# Patient Record
Sex: Male | Born: 1965 | Race: Black or African American | Hispanic: No | Marital: Married | Smoking: Never smoker
Health system: Southern US, Community
[De-identification: ages and names within clinical notes are randomized; demographics above are authoritative.]

## PROBLEM LIST (undated history)

## (undated) DIAGNOSIS — I10 Essential (primary) hypertension: Secondary | ICD-10-CM

## (undated) DIAGNOSIS — G47419 Narcolepsy without cataplexy: Secondary | ICD-10-CM

## (undated) HISTORY — PX: ROTATOR CUFF REPAIR: SHX139

## (undated) HISTORY — PX: NECK SURGERY: SHX720

---

## 2013-09-03 ENCOUNTER — Encounter (HOSPITAL_BASED_OUTPATIENT_CLINIC_OR_DEPARTMENT_OTHER): Payer: Self-pay | Admitting: Emergency Medicine

## 2013-09-03 ENCOUNTER — Emergency Department (HOSPITAL_BASED_OUTPATIENT_CLINIC_OR_DEPARTMENT_OTHER)
Admission: EM | Admit: 2013-09-03 | Discharge: 2013-09-04 | Disposition: A | Discharge status: Home / Self Care | Attending: Emergency Medicine | Admitting: Emergency Medicine

## 2013-09-03 DIAGNOSIS — L739 Follicular disorder, unspecified: Secondary | ICD-10-CM

## 2013-09-03 DIAGNOSIS — L678 Other hair color and hair shaft abnormalities: Secondary | ICD-10-CM | POA: Insufficient documentation

## 2013-09-03 DIAGNOSIS — G47419 Narcolepsy without cataplexy: Secondary | ICD-10-CM | POA: Insufficient documentation

## 2013-09-03 DIAGNOSIS — Z79899 Other long term (current) drug therapy: Secondary | ICD-10-CM | POA: Insufficient documentation

## 2013-09-03 DIAGNOSIS — L738 Other specified follicular disorders: Secondary | ICD-10-CM | POA: Insufficient documentation

## 2013-09-03 HISTORY — DX: Narcolepsy without cataplexy: G47.419

## 2013-09-03 NOTE — ED Notes (Signed)
Rash under left axilla since yesterday.  Noted several small hard nodules.  No drainage or severe swelling.

## 2013-09-03 NOTE — ED Notes (Signed)
Raised rash under right arm pit and pt reports pain to right shoulder nonradiating 6/10

## 2013-09-04 MED ORDER — IBUPROFEN 600 MG PO TABS: 600.0000 mg | ORAL_TABLET | Freq: Four times a day (QID) | ORAL | Status: AC | PRN

## 2013-09-04 NOTE — ED Provider Notes (Signed)
CSN: [id number]     Arrival date & time 09/03/13  2258 History  This chart was scribed for Dillon Mason Dillon Cords, MD by Caryn Bee, ED Scribe. This patient was seen in room MH04/MH04 and the patient's care was started 12:25 AM.    Chief Complaint  Patient presents with  . Rash    Patient is a 47 y.o. male presenting with rash. The history is provided by the patient. No language interpreter was used.  Rash Location: right axilla. Severity:  Mild Onset quality:  Gradual Duration:  1 day Timing:  Constant Progression:  Unchanged Chronicity:  New Context: not animal contact   Relieved by:  Nothing Worsened by:  Nothing tried Ineffective treatments:  None tried Associated symptoms: no fever    HPI Comments: Dillon Mason is a 47 y.o. male who presents to the Emergency Department complaining of gradual onset, constant rash to the right axilla that began yesterday. Pt denies any previous trauma to the area. Pt denies pain to touch, but pain is worse when the arm is positioned downwards.Pt denies trying any new deodorants, soaps, or detergents. Pt denies fever.   Past Medical History  Diagnosis Date  . Narcolepsy    Past Surgical History  Procedure Laterality Date  . Rotator cuff repair     History reviewed. No pertinent family history. History  Substance Use Topics  . Smoking status: Never Smoker   . Smokeless tobacco: Not on file  . Alcohol Use: No    Review of Systems  Constitutional: Negative for fever.  Skin: Positive for rash (right axilla).  All other systems reviewed and are negative.    Allergies  Review of patient's allergies indicates no known allergies.  Home Medications   Current Outpatient Rx  Name  Route  Sig  Dispense  Refill  . modafinil (PROVIGIL) 100 MG tablet   Oral   Take 100 mg by mouth daily.          BP 120/81  Pulse 70  Temp(Src) 98.6 F (37 C) (Oral)  Resp 16  Ht 6\' 2"  (1.88 m)  Wt 226 lb (102.513 kg)  BMI 29 kg/m2  SpO2  99% Physical Exam  Nursing note and vitals reviewed. Constitutional: He is oriented to person, place, and time. He appears well-developed and well-nourished.  HENT:  Head: Normocephalic and atraumatic.  Mouth/Throat: Oropharynx is clear and moist.  Eyes: Pupils are equal, round, and reactive to light.  Neck: Normal range of motion. Neck supple.  Cardiovascular: Normal rate, regular rhythm and normal heart sounds.   Pulmonary/Chest: Effort normal and breath sounds normal. He has no wheezes. He has no rales.  Abdominal: Soft. Bowel sounds are normal. There is no tenderness. There is no rebound and no guarding.  Musculoskeletal: Normal range of motion.  Lymphadenopathy:       Right axillary: No pectoral and no lateral adenopathy present.  Neurological: He is alert and oriented to person, place, and time.  Skin: Rash (right axilla) noted.  Non vesicular Inflammed follicles on right axilla and upper back with hairs growing out of each follicle. No white heads. No heads on bumps at all. No abscesses.   Psychiatric: He has a normal mood and affect.    ED Course  Procedures (including critical care time) DIAGNOSTIC STUDIES: Oxygen Saturation is 99% on room air, normal by my interpretation.    COORDINATION OF CARE: 12:21 AM-Advised pt not to use deodorant for 7 days. Will prescribe ibuprofen. Discussed treatment plan with pt  at bedside and pt agreed to plan.   Labs Review Labs Reviewed - No data to display Imaging Review No results found.  MDM  No diagnosis found. Inflamed follicles, not infected with ingrown hairs.  Keep clean and dry and give several days without roll on anti persperant   I personally performed the services described in this documentation, which was scribed in my presence. The recorded information has been reviewed and is accurate.     Jasmine Awe, MD 09/04/13 [phone]

## 2015-01-14 ENCOUNTER — Ambulatory Visit: Admitting: Family Medicine

## 2017-05-01 ENCOUNTER — Encounter (HOSPITAL_BASED_OUTPATIENT_CLINIC_OR_DEPARTMENT_OTHER): Payer: Self-pay | Admitting: Emergency Medicine

## 2017-05-01 ENCOUNTER — Emergency Department (HOSPITAL_BASED_OUTPATIENT_CLINIC_OR_DEPARTMENT_OTHER)
Admission: EM | Admit: 2017-05-01 | Discharge: 2017-05-01 | Disposition: A | Discharge status: Home / Self Care | Attending: Emergency Medicine | Admitting: Emergency Medicine

## 2017-05-01 DIAGNOSIS — Z113 Encounter for screening for infections with a predominantly sexual mode of transmission: Secondary | ICD-10-CM | POA: Diagnosis present

## 2017-05-01 DIAGNOSIS — Z711 Person with feared health complaint in whom no diagnosis is made: Secondary | ICD-10-CM

## 2017-05-01 DIAGNOSIS — Z202 Contact with and (suspected) exposure to infections with a predominantly sexual mode of transmission: Secondary | ICD-10-CM | POA: Insufficient documentation

## 2017-05-01 LAB — URINALYSIS, ROUTINE W REFLEX MICROSCOPIC
Bilirubin Urine: NEGATIVE
Glucose, UA: NEGATIVE mg/dL
Hgb urine dipstick: NEGATIVE
Ketones, ur: NEGATIVE mg/dL
Leukocytes, UA: NEGATIVE
Nitrite: NEGATIVE
Protein, ur: NEGATIVE mg/dL
Specific Gravity, Urine: 1.012 (ref 1.005–1.030)
pH: 6 (ref 5.0–8.0)

## 2017-05-01 MED ORDER — FLUCONAZOLE 50 MG PO TABS
150.0000 mg | ORAL_TABLET | Freq: Once | ORAL | Status: AC
  Administered 2017-05-01: 150 mg via ORAL
  Filled 2017-05-01: qty 1

## 2017-05-01 NOTE — ED Notes (Signed)
Pt discharged to home with family. NAD.  

## 2017-05-01 NOTE — ED Triage Notes (Signed)
Pt sts his wife has recurrent yeast infections and her doctor suggested pt be checked to see if he is giving her yeast infections. Pt denies any sxs.

## 2017-05-01 NOTE — ED Provider Notes (Signed)
[facility]   CSN: [id number] Arrival date & time: 05/01/17  1742   By signing my name below, I, Clarisse GougeXavier Herndon, attest that this documentation has been prepared under the direction and in the presence of Raeford RazorKohut, Aloria Looper, MD. Electronically signed, Clarisse GougeXavier Herndon, ED Scribe. 05/01/17. 8:24 PM.  History   Chief Complaint Chief Complaint  Patient presents with  . SEXUALLY TRANSMITTED DISEASE   The history is provided by the patient and medical records. No language interpreter was used.    Dillon Mason is a 51 y.o. male who presents to the Emergency Department for asymptomatic STD screening. Pt reports for STD check following advice from his wife's PCP, who reportedly told him to get checked to R/O the possibility that he is causing recurring yeast infections for his wife. No penile itching, pain other complaints at this time.   Past Medical History:  Diagnosis Date  . Narcolepsy     There are no active problems to display for this patient.   Past Surgical History:  Procedure Laterality Date  . ROTATOR CUFF REPAIR         Home Medications    Prior to Admission medications   Medication Sig Start Date End Date Taking? Authorizing Provider  ibuprofen (ADVIL,MOTRIN) 600 MG tablet Take 1 tablet (600 mg total) by mouth every 6 (six) hours as needed for pain. 09/04/13   Palumbo, April, MD  modafinil (PROVIGIL) 100 MG tablet Take 100 mg by mouth daily.    [provider]    Family History No family history on file.  Social History Social History  Substance Use Topics  . Smoking status: Never Smoker  . Smokeless tobacco: Never Used  . Alcohol use No     Allergies   Patient has no known allergies.   Review of Systems Review of Systems  Genitourinary: Negative for decreased urine volume, difficulty urinating, discharge, dysuria, penile pain, penile swelling, scrotal swelling and testicular pain.  Skin: Negative for color change and rash.    All other systems reviewed and are negative.    Physical Exam Updated Vital Signs BP (!) 131/100   Pulse 96   Temp 98.6 F (37 C) (Oral)   Resp 20   Ht 6\' 2"  (1.88 m)   Wt 230 lb (104.3 kg)   SpO2 100%   BMI 29.53 kg/m   Physical Exam  Constitutional: He is oriented to person, place, and time. He appears well-developed and well-nourished.  HENT:  Head: Normocephalic.  Eyes: EOM are normal.  Neck: Normal range of motion.  Pulmonary/Chest: Effort normal.  Abdominal: He exhibits no distension.  Genitourinary: Penis normal. Uncircumcised. No penile tenderness. No discharge found.  Musculoskeletal: Normal range of motion.  Neurological: He is alert and oriented to person, place, and time.  Psychiatric: He has a normal mood and affect.  Nursing note and vitals reviewed.    ED Treatments / Results  DIAGNOSTIC STUDIES: Oxygen Saturation is 100% on RA, NL by my interpretation.    COORDINATION OF CARE: 8:21 PM-Discussed next steps with pt. Pt verbalized understanding and is agreeable with the plan. Will order medications. Pt prepared for d/c, advised of symptomatic care at home and return precautions.    Labs (all labs ordered are listed, but only abnormal results are displayed) Labs Reviewed  URINALYSIS, ROUTINE W REFLEX MICROSCOPIC    EKG  EKG Interpretation None       Radiology No results found.  Procedures Procedures (including critical care time)  Medications Ordered in ED Medications - No data to display   Initial Impression / Assessment and Plan / ED Course  I have reviewed the triage vital signs and the nursing notes.  Pertinent labs & imaging results that were available during my care of the patient were reviewed by me and considered in my medical decision making (see chart for details).     50yM with partner that gets frequent yeast infections and he wanted "checked out." Tried having discussion about basic pathophysiology of male  vulvovaginitis but ended up giving him a dose of diflucan. This made him happy. His exam is unremarkable. Declined STD testing or further empiric tx.   Final Clinical Impressions(s) / ED Diagnoses   Final diagnoses:  Concern about sexually transmitted disease in male without diagnosis    New Prescriptions New Prescriptions   No medications on file    I personally preformed the services scribed in my presence. The recorded information has been reviewed is accurate. Raeford Razor, MD.    Raeford Razor, MD 05/09/17 [phone]

## 2017-06-12 ENCOUNTER — Emergency Department (HOSPITAL_BASED_OUTPATIENT_CLINIC_OR_DEPARTMENT_OTHER)

## 2017-06-12 ENCOUNTER — Encounter (HOSPITAL_BASED_OUTPATIENT_CLINIC_OR_DEPARTMENT_OTHER): Payer: Self-pay | Admitting: Emergency Medicine

## 2017-06-12 ENCOUNTER — Emergency Department (HOSPITAL_BASED_OUTPATIENT_CLINIC_OR_DEPARTMENT_OTHER)
Admission: EM | Admit: 2017-06-12 | Discharge: 2017-06-12 | Disposition: A | Discharge status: Home / Self Care | Attending: Emergency Medicine | Admitting: Emergency Medicine

## 2017-06-12 DIAGNOSIS — J181 Lobar pneumonia, unspecified organism: Secondary | ICD-10-CM | POA: Diagnosis not present

## 2017-06-12 DIAGNOSIS — Z79899 Other long term (current) drug therapy: Secondary | ICD-10-CM | POA: Insufficient documentation

## 2017-06-12 DIAGNOSIS — J189 Pneumonia, unspecified organism: Secondary | ICD-10-CM

## 2017-06-12 DIAGNOSIS — Z791 Long term (current) use of non-steroidal anti-inflammatories (NSAID): Secondary | ICD-10-CM | POA: Insufficient documentation

## 2017-06-12 DIAGNOSIS — R05 Cough: Secondary | ICD-10-CM | POA: Diagnosis present

## 2017-06-12 DIAGNOSIS — I10 Essential (primary) hypertension: Secondary | ICD-10-CM | POA: Diagnosis not present

## 2017-06-12 MED ORDER — DOXYCYCLINE HYCLATE 100 MG PO CAPS: 100.0000 mg | ORAL_CAPSULE | Freq: Two times a day (BID) | ORAL | 0 refills | Status: AC

## 2017-06-12 MED ORDER — DOXYCYCLINE HYCLATE 100 MG PO TABS
100.0000 mg | ORAL_TABLET | Freq: Once | ORAL | Status: AC
  Administered 2017-06-12: 100 mg via ORAL
  Filled 2017-06-12: qty 1

## 2017-06-12 NOTE — ED Provider Notes (Signed)
[facility]   CSN: [id number] Arrival date & time: 06/12/17  1956  By signing my name below, I, Rosana Fret, attest that this documentation has been prepared under the direction and in the presence of Cardama, Amadeo Garnet, MD. Electronically Signed: Rosana Fret, ED Scribe. 06/12/17. 10:27 PM.  History   Chief Complaint Chief Complaint  Patient presents with  . Cough  . Chest Pain   The history is provided by the patient. No language interpreter was used.   HPI Comments: Dillon Mason is a 51 y.o. male with a PMHx of GERD and HTN, who presents to the Emergency Department complaining of an intermittent, mildly productive cough onset 2 days ago. Pt reports associated chest congestion and he describes the pain as a pressure sensation. Per pt, he had cold symptoms 2 weeks ago that resolved. 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 Pt states he uses a CPAP machine but not regularly. Pt does not take medication for his HTN. Pt denies hx of CAD, PE/DVT, DM, HLD and asthma. No recent long travel, surgery, fracture, prolonged immobilization, or exogenous testosterone usage. No family hx of CAD. No tobacco use. Pt denies current rhinorrhea, fever, nausea, abdominal pain or any other complaints at this time.  Past Medical History:  Diagnosis Date  . Narcolepsy     There are no active problems to display for this patient.   Past Surgical History:  Procedure Laterality Date  . ROTATOR CUFF REPAIR         Home Medications    Prior to Admission medications   Medication Sig Start Date End Date Taking? Authorizing Provider  doxycycline (VIBRAMYCIN) 100 MG capsule Take 1 capsule (100 mg total) by mouth 2 (two) times daily. 06/12/17 06/19/17  Nira Conn, MD  ibuprofen (ADVIL,MOTRIN) 600 MG tablet Take 1 tablet (600 mg total) by mouth every 6 (six) hours as needed for pain. 09/04/13   Palumbo, April, MD  modafinil (PROVIGIL) 100 MG tablet Take 100 mg by  mouth daily.    [provider]    Family History No family history on file.  Social History Social History  Substance Use Topics  . Smoking status: Never Smoker  . Smokeless tobacco: Never Used  . Alcohol use No     Allergies   Patient has no known allergies.   Review of Systems Review of Systems All other systems reviewed and are negative for acute change except as noted in the HPI.  Physical Exam Updated Vital Signs BP 127/80 (BP Location: Left Arm)   Pulse 98   Temp 98.5 F (36.9 C) (Oral)   Resp 20   SpO2 97%   Physical Exam  Constitutional: He is oriented to person, place, and time. He appears well-developed and well-nourished. No distress.  HENT:  Head: Normocephalic and atraumatic.  Nose: Nose normal.  Eyes: Conjunctivae and EOM are normal. Pupils are equal, round, and reactive to light. Right eye exhibits no discharge. Left eye exhibits no discharge. No scleral icterus.  Neck: Normal range of motion. Neck supple.  Cardiovascular: Normal rate and regular rhythm.  Exam reveals no gallop and no friction rub.   No murmur heard. Pulmonary/Chest: Effort normal and breath sounds normal. No stridor. No respiratory distress. He has no rales.  Abdominal: Soft. He exhibits no distension. There is no tenderness.  Musculoskeletal: He exhibits no edema or tenderness.  Neurological: He is alert and oriented to person, place, and time.  Skin: Skin is warm  and dry. No rash noted. He is not diaphoretic. No erythema.  Psychiatric: He has a normal mood and affect.  Vitals reviewed.    ED Treatments / Results  DIAGNOSTIC STUDIES: Oxygen Saturation is 100% on RA, normal by my interpretation.   COORDINATION OF CARE: 10:24 PM-Discussed next steps with pt including treatment for PNA and follow up with PCP. Pt verbalized understanding and is agreeable with the plan.   Labs (all labs ordered are listed, but only abnormal results are displayed) Labs Reviewed - No  data to display  EKG  EKG Interpretation  Date/Time:  Saturday June 12 2017 20:04:03 EDT Ventricular Rate:  95 PR Interval:  170 QRS Duration: 94 QT Interval:  352 QTC Calculation: 442 R Axis:   33 Text Interpretation:  Normal sinus rhythm Nonspecific T wave abnormality Abnormal ECG no stemi   No old tracing to compare Confirmed by Drema Pry [id number]) on 06/12/2017 8:47:25 PM       Radiology Dg Chest 2 View  Result Date: 06/12/2017 CLINICAL DATA:  Chest pain EXAM: CHEST  2 VIEW COMPARISON:  None. FINDINGS: No pneumothorax. The heart, hila, and mediastinum are normal. Coarsened lung markings. Possible more focal subtle infiltrate in medial right lung base on the frontal view. No other acute abnormalities. IMPRESSION: Coarsened lung markings may represent bronchitic change. Mild opacity in the medial right lung base could represent an early infiltrate in the appropriate clinical setting. Electronically Signed   By: Gerome Sam III M.D   On: 06/12/2017 20:48    Procedures Procedures (including critical care time)  Medications Ordered in ED Medications  doxycycline (VIBRA-TABS) tablet 100 mg (100 mg Oral Given 06/12/17 2321)     Initial Impression / Assessment and Plan / ED Course  I have reviewed the triage vital signs and the nursing notes.  Pertinent labs & imaging results that were available during my care of the patient were reviewed by me and considered in my medical decision making (see chart for details).     Chest x-ray with RLL opacity consistent with CAP. No evidence suggestive of pneumothorax, pneumomediastinum.  No abnormal contour of the mediastinum to suggest dissection. No evidence of acute injuries.  Low suspicion for cardiac etiology including ACS or HF. Low pretest probability for pulmonary embolism. Presentation not classic for aortic dissection or esophageal perforation.  Will treat with doxycycline given that this followed a likely viral process. Given  first dose here.  The patient is safe for discharge with strict return precautions.   Final Clinical Impressions(s) / ED Diagnoses   Final diagnoses:  Community acquired pneumonia of right lower lobe of lung (HCC)   Disposition: Discharge  Condition: Good  I have discussed the results, Dx and Tx plan with the patient who expressed understanding and agree(s) with the plan. Discharge instructions discussed at great length. The patient was given strict return precautions who verbalized understanding of the instructions. No further questions at time of discharge.    Discharge Medication List as of 06/12/2017 10:51 PM    START taking these medications   Details  doxycycline (VIBRAMYCIN) 100 MG capsule Take 1 capsule (100 mg total) by mouth 2 (two) times daily., Starting Sat 06/12/2017, Until Sat 06/19/2017, Print        Follow Up: Janece Canterbury, MD [address] [place] [address] [phone]  Schedule an appointment as soon as possible for a visit  in 5-7 days, If symptoms do not improve or  worsen   I personally performed  the services described in this documentation, which was scribed in my presence. The recorded information has been reviewed and is accurate.        Nira Connardama, Pedro Eduardo, MD 06/12/17 2350

## 2017-06-12 NOTE — ED Triage Notes (Signed)
Pt presents with c/o cough for a couple of days and chest pain that started today. Pt states he has chest pain when taking a deep breath also.

## 2021-01-24 ENCOUNTER — Encounter (HOSPITAL_BASED_OUTPATIENT_CLINIC_OR_DEPARTMENT_OTHER): Payer: Self-pay

## 2021-01-24 ENCOUNTER — Emergency Department (HOSPITAL_BASED_OUTPATIENT_CLINIC_OR_DEPARTMENT_OTHER)

## 2021-01-24 ENCOUNTER — Emergency Department (HOSPITAL_BASED_OUTPATIENT_CLINIC_OR_DEPARTMENT_OTHER)
Admission: EM | Admit: 2021-01-24 | Discharge: 2021-01-24 | Disposition: A | Discharge status: Home / Self Care | Attending: Emergency Medicine | Admitting: Emergency Medicine

## 2021-01-24 ENCOUNTER — Other Ambulatory Visit: Payer: Self-pay

## 2021-01-24 DIAGNOSIS — S62636A Displaced fracture of distal phalanx of right little finger, initial encounter for closed fracture: Secondary | ICD-10-CM | POA: Insufficient documentation

## 2021-01-24 DIAGNOSIS — W230XXA Caught, crushed, jammed, or pinched between moving objects, initial encounter: Secondary | ICD-10-CM | POA: Diagnosis not present

## 2021-01-24 DIAGNOSIS — I10 Essential (primary) hypertension: Secondary | ICD-10-CM | POA: Diagnosis not present

## 2021-01-24 DIAGNOSIS — S6991XA Unspecified injury of right wrist, hand and finger(s), initial encounter: Secondary | ICD-10-CM | POA: Diagnosis present

## 2021-01-24 HISTORY — DX: Essential (primary) hypertension: I10

## 2021-01-24 MED ORDER — ACETAMINOPHEN 325 MG PO TABS
650.0000 mg | ORAL_TABLET | Freq: Once | ORAL | Status: AC
  Administered 2021-01-24: 650 mg via ORAL
  Filled 2021-01-24: qty 2

## 2021-01-24 NOTE — Discharge Instructions (Addendum)
Call your primary care doctor or specialist as discussed in the next 5-6 days.   Return immediately back to the ER if:  Your symptoms worsen within the next 12-24 hours. You develop new symptoms such as new fevers, persistent vomiting, new pain, shortness of breath, or new weakness or numbness, or if you have any other concerns.  

## 2021-01-24 NOTE — ED Provider Notes (Signed)
[facility] EMERGENCY DEPARTMENT Provider Note   CSN: [id number] Arrival date & time: 01/24/21  2013     History Chief Complaint  Patient presents with  . Finger Injury    Dillon Mason is a 55 y.o. male.  Patient presents with pain to the right and fifth finger.  He states he was closing luggage door when it slammed down onto his right hand fifth finger.  Injury occurred earlier today.  Denies injury anywhere else.  No fever no vomiting cough no diarrhea.        Past Medical History:  Diagnosis Date  . Hypertension   . Narcolepsy     There are no problems to display for this patient.   Past Surgical History:  Procedure Laterality Date  . NECK SURGERY    . ROTATOR CUFF REPAIR         No family history on file.  Social History   Tobacco Use  . Smoking status: Never Smoker  . Smokeless tobacco: Never Used  Vaping Use  . Vaping Use: Never used  Substance Use Topics  . Alcohol use: No  . Drug use: No    Home Medications Prior to Admission medications   Medication Sig Start Date End Date Taking? Authorizing Provider  ibuprofen (ADVIL,MOTRIN) 600 MG tablet Take 1 tablet (600 mg total) by mouth every 6 (six) hours as needed for pain. 09/04/13   Palumbo, April, MD  modafinil (PROVIGIL) 100 MG tablet Take 100 mg by mouth daily.    [provider]    Allergies    Patient has no known allergies.  Review of Systems   Review of Systems  Constitutional: Negative for fever.  HENT: Negative for ear pain and sore throat.   Eyes: Negative for pain.  Respiratory: Negative for cough.   Cardiovascular: Negative for chest pain.  Gastrointestinal: Negative for abdominal pain.  Genitourinary: Negative for flank pain.  Musculoskeletal: Negative for back pain.  Skin: Negative for color change and rash.  Neurological: Negative for syncope.  All other systems reviewed and are negative.   Physical Exam Updated Vital Signs BP (!) 133/109 (BP Location:  Right Arm)   Pulse 73   Temp 98.8 F (37.1 C) (Oral)   Resp 20   SpO2 99%   Physical Exam Constitutional:      General: He is not in acute distress.    Appearance: He is well-developed.  HENT:     Head: Normocephalic.     Nose: Nose normal.  Eyes:     Extraocular Movements: Extraocular movements intact.  Cardiovascular:     Rate and Rhythm: Normal rate.  Pulmonary:     Effort: Pulmonary effort is normal.  Musculoskeletal:     Comments: No visible deformity noted on exam.  No skin laceration or significant swelling noted.  No ungual hematoma noted or ecchymosis noted.  Tenderness palpation right hand fifth finger distal tip.  Nailbed appears intact with no obvious injury on visual exam.    Skin:    Coloration: Skin is not jaundiced.  Neurological:     Mental Status: He is alert. Mental status is at baseline.     ED Results / Procedures / Treatments   Labs (all labs ordered are listed, but only abnormal results are displayed) Labs Reviewed - No data to display  EKG None  Radiology DG Finger Little Right  Result Date: 01/24/2021 CLINICAL DATA:  Fifth digit injury today with pain, initial encounter EXAM: RIGHT LITTLE FINGER 2+V  COMPARISON:  None. FINDINGS: Transverse fracture is noted through the fifth distal phalanx with mild posterior displacement of the distal fracture fragment. No soft tissue abnormality is noted. IMPRESSION: Fifth distal phalangeal fracture. Electronically Signed   By: Alcide Clever M.D.   On: 01/24/2021 20:52    Procedures .Ortho Injury Treatment  Date/Time: 01/24/2021 10:34 PM Performed by: Cheryll Cockayne, MD Authorized by: Cheryll Cockayne, MD  Comments: Splint to right hand distal fingertip.  Neurovascular tact after placement.      Medications Ordered in ED Medications  acetaminophen (TYLENOL) tablet 650 mg (650 mg Oral Given 01/24/21 2216)    ED Course  I have reviewed the triage vital signs and the nursing notes.  Pertinent labs &  imaging results that were available during my care of the patient were reviewed by me and considered in my medical decision making (see chart for details).    MDM Rules/Calculators/A&P                          Active the distal fingertip seen on x-ray.  Splint of the fingertip placed.  Norvasc intact after placement.  Advised follow-up with hand surgery within the week.  Advised me to return for worsening pain fevers or any additional concerns.   Final Clinical Impression(s) / ED Diagnoses Final diagnoses:  Closed displaced fracture of distal phalanx of right little finger, initial encounter    Rx / DC Orders ED Discharge Orders    None       Cheryll Cockayne, MD 01/24/21 2234

## 2021-01-24 NOTE — ED Triage Notes (Signed)
Pt states he "got finger bent up" right pinky finger ~930am-NAD-steady gait

## 2021-01-24 NOTE — ED Notes (Signed)
Injury to rt 5th digit, states injured finger when closing the luggage bin on a touring bus, finger was deformed, splint has been ordered by EDP, color WNL, capillary refill WNL, temp and sensation WNL as well, Tylenol PO for pain given per orders

## 2021-01-24 NOTE — ED Notes (Signed)
Finger Splint applied to Rt 5th digit per EDP orders, splint applied by EMT NT. Finger reevaluated post splint application

## 2022-03-02 ENCOUNTER — Emergency Department (HOSPITAL_BASED_OUTPATIENT_CLINIC_OR_DEPARTMENT_OTHER)

## 2022-03-02 ENCOUNTER — Other Ambulatory Visit: Payer: Self-pay

## 2022-03-02 ENCOUNTER — Emergency Department (HOSPITAL_BASED_OUTPATIENT_CLINIC_OR_DEPARTMENT_OTHER)
Admission: EM | Admit: 2022-03-02 | Discharge: 2022-03-02 | Disposition: A | Discharge status: Home / Self Care | Attending: Emergency Medicine | Admitting: Emergency Medicine

## 2022-03-02 ENCOUNTER — Encounter (HOSPITAL_BASED_OUTPATIENT_CLINIC_OR_DEPARTMENT_OTHER): Payer: Self-pay | Admitting: Urology

## 2022-03-02 DIAGNOSIS — I10 Essential (primary) hypertension: Secondary | ICD-10-CM | POA: Insufficient documentation

## 2022-03-02 DIAGNOSIS — Y93H2 Activity, gardening and landscaping: Secondary | ICD-10-CM | POA: Diagnosis not present

## 2022-03-02 DIAGNOSIS — S39012A Strain of muscle, fascia and tendon of lower back, initial encounter: Secondary | ICD-10-CM | POA: Diagnosis not present

## 2022-03-02 DIAGNOSIS — X58XXXA Exposure to other specified factors, initial encounter: Secondary | ICD-10-CM | POA: Insufficient documentation

## 2022-03-02 DIAGNOSIS — Y92007 Garden or yard of unspecified non-institutional (private) residence as the place of occurrence of the external cause: Secondary | ICD-10-CM | POA: Insufficient documentation

## 2022-03-02 DIAGNOSIS — S3992XA Unspecified injury of lower back, initial encounter: Secondary | ICD-10-CM | POA: Diagnosis present

## 2022-03-02 LAB — URINALYSIS, ROUTINE W REFLEX MICROSCOPIC
Bilirubin Urine: NEGATIVE
Glucose, UA: NEGATIVE mg/dL
Hgb urine dipstick: NEGATIVE
Ketones, ur: NEGATIVE mg/dL
Leukocytes,Ua: NEGATIVE
Nitrite: NEGATIVE
Protein, ur: NEGATIVE mg/dL
Specific Gravity, Urine: >=1.03 (ref 1.005–1.030)
pH: 5.5 (ref 5.0–8.0)

## 2022-03-02 MED ORDER — MELOXICAM 15 MG PO TABS: 15.0000 mg | ORAL_TABLET | Freq: Every day | ORAL | 0 refills | Status: AC

## 2022-03-02 MED ORDER — METHOCARBAMOL 500 MG PO TABS: 500.0000 mg | ORAL_TABLET | Freq: Two times a day (BID) | ORAL | 0 refills | Status: AC

## 2022-03-02 NOTE — ED Provider Notes (Signed)
?[facility] EMERGENCY DEPARTMENT ?Provider Note ? ? ?CSN: [id number] ?Arrival date & time: 03/02/22  2113 ? ?  ? ?History ? ?Chief Complaint  ?Patient presents with  ? Back Pain  ? ? ?Dillon Mason is a 56 y.o. male.  The patient presents to the emergency department complaining of right sided lumbar pain. The patient states his pain began after doing heavy yardwork approximately one week ago. He was seen by his PCP and was diagnosed with a muscle strain. He denies known injury. Denies saddle anesthesia or incontinence. PMH significant for HTN and narcolepsy ? ?HPI ? ?  ? ?Home Medications ?Prior to Admission medications   ?Medication Sig Start Date End Date Taking? Authorizing Provider  ?meloxicam (MOBIC) 15 MG tablet Take 1 tablet (15 mg total) by mouth daily for 15 days. 03/02/22 03/17/22 Yes Darrick Grinder, PA-C  ?methocarbamol (ROBAXIN) 500 MG tablet Take 1 tablet (500 mg total) by mouth 2 (two) times daily. 03/02/22  Yes Darrick Grinder, PA-C  ?ibuprofen (ADVIL,MOTRIN) 600 MG tablet Take 1 tablet (600 mg total) by mouth every 6 (six) hours as needed for pain. 09/04/13   Palumbo, April, MD  ?modafinil (PROVIGIL) 100 MG tablet Take 100 mg by mouth daily.    [provider]  ?   ? ?Allergies    ?Patient has no known allergies.   ? ?Review of Systems   ?Review of Systems  ?Constitutional:  Negative for fever and unexpected weight change.  ?Respiratory:  Negative for shortness of breath.   ?Cardiovascular:  Negative for chest pain.  ?Gastrointestinal:  Negative for abdominal pain.  ?Genitourinary:  Negative for dysuria, frequency and hematuria.  ?Musculoskeletal:  Positive for back pain.  ?Neurological:  Negative for numbness.  ? ?Physical Exam ?Updated Vital Signs ?BP (!) 133/104 (BP Location: Left Arm)   Pulse 64   Temp 97.6 ?F (36.4 ?C) (Oral)   Resp 18   Ht 6\' 2"  (1.88 m)   Wt 108.9 kg   SpO2 100%   BMI 30.81 kg/m?  ?Physical Exam ?Constitutional:   ?   General: He is not in acute  distress. ?HENT:  ?   Head: Normocephalic.  ?Eyes:  ?   Conjunctiva/sclera: Conjunctivae normal.  ?Cardiovascular:  ?   Rate and Rhythm: Normal rate and regular rhythm.  ?   Pulses: Normal pulses.  ?Pulmonary:  ?   Effort: Pulmonary effort is normal.  ?   Breath sounds: Normal breath sounds.  ?Musculoskeletal:     ?   General: Tenderness (Mild tenderness to palpation of the right lumbar region. No palpation over spine or paraspinal muscles) present. No swelling.  ?   Cervical back: Normal range of motion.  ?Skin: ?   General: Skin is warm and dry.  ?Neurological:  ?   General: No focal deficit present.  ?   Mental Status: He is alert.  ? ? ?ED Results / Procedures / Treatments   ?Labs ?(all labs ordered are listed, but only abnormal results are displayed) ?Labs Reviewed  ?URINALYSIS, ROUTINE W REFLEX MICROSCOPIC  ? ? ?EKG ?None ? ?Radiology ?DG Lumbar Spine Complete ? ?Result Date: 03/02/2022 ?CLINICAL DATA:  Low back pain for 2 weeks, no known injury, initial encounter EXAM: LUMBAR SPINE - COMPLETE 4+ VIEW COMPARISON:  None. FINDINGS: Five lumbar type vertebral bodies are well visualized. Vertebral body height is well maintained. Osteophytic changes are noted from L2-L5. Disc space narrowing at L3-4 and L4-5 is noted. No anterolisthesis is seen. No  pars defects are noted. No soft tissue changes are seen. IMPRESSION: Degenerative change without acute abnormality. Electronically Signed   By: Alcide Clever M.D.   On: 03/02/2022 21:59   ? ?Procedures ?Procedures  ? ?Medications Ordered in ED ?Medications - No data to display ? ?ED Course/ Medical Decision Making/ A&P ?  ?                        ?Medical Decision Making ?Amount and/or Complexity of Data Reviewed ?Labs: ordered. ?Radiology: ordered. ? ?Risk ?Prescription drug management. ? ? ?The patient presents with lumbar back pain. Differential diagnosis includes muscle strain, DDD, fracture, cauda equina, and others. Imaging of the lumbar spine was completed in triage  which showed no acute injury. It did show some degenerative changes. The patient has no symptoms indicative of cauda equina. A urinalysis was performed in triage which was unremarkable. The patient has no CVA tenderness. I believe that kidney stones or pyelonephritis is unlikely. The patient likely has a muscle strain as previously diagnosed by his PCP. I see no indication for further lab work or imaging at this time and see no reason for hospital admission. The patient may discharge home. I will prescribe Meloxicam and a muscle relaxer. I advised the patient to not drive while taking the muscle relaxer. I also advised the patient to not lift heavy objects for the next few weeks and recommended heat or ice for relief. The patient agreed with the plan. Discharge home. Return precautions provided.  ? ?Final Clinical Impression(s) / ED Diagnoses ?Final diagnoses:  ?Strain of lumbar region, initial encounter  ? ? ?Rx / DC Orders ?ED Discharge Orders   ? ?      Ordered  ?  meloxicam (MOBIC) 15 MG tablet  Daily       ? 03/02/22 2322  ?  methocarbamol (ROBAXIN) 500 MG tablet  2 times daily       ? 03/02/22 2322  ? ?  ?  ? ?  ? ? ?  ?Darrick Grinder, PA-C ?03/02/22 2337 ? ?  ?Rozelle Logan, DO ?03/03/22 1620 ? ?

## 2022-03-02 NOTE — Discharge Instructions (Addendum)
You were seen today for lower right back pain. This is likely a muscle strain. Your x-rays showed degenerative changes in the lumbar spine but no acute injury. Muscle strains are treated with rest, heat, NSAIDs, and Tylenol as needed. I have provided a prescription for Meloxicam which is to be taken once daily and a muscle relaxer which is to be taken as needed prior to sleep.  Do not drive while taking the muscle relaxer. Use heat as needed. Follow up with your PCP if the problem continues. Return to the emergency department if you develop life threats such as chest pain or shortness of breath ?

## 2022-03-02 NOTE — ED Notes (Signed)
Pt states unable to urinate at this time, given spec cup while waiting ?

## 2022-03-02 NOTE — ED Triage Notes (Signed)
Lower right back pain x 2 weeks ?Denies any injury  ?Saw pcp x 1 week ago , dx with pulled muscle  ?Denies any urinary symptoms  ?Pain worse with movement  ?

## 2022-09-27 ENCOUNTER — Encounter (HOSPITAL_BASED_OUTPATIENT_CLINIC_OR_DEPARTMENT_OTHER): Payer: Self-pay

## 2022-09-27 ENCOUNTER — Other Ambulatory Visit: Payer: Self-pay

## 2022-09-27 ENCOUNTER — Emergency Department (HOSPITAL_BASED_OUTPATIENT_CLINIC_OR_DEPARTMENT_OTHER)
Admission: EM | Admit: 2022-09-27 | Discharge: 2022-09-27 | Disposition: A | Discharge status: Home / Self Care | Attending: Emergency Medicine | Admitting: Emergency Medicine

## 2022-09-27 DIAGNOSIS — J069 Acute upper respiratory infection, unspecified: Secondary | ICD-10-CM | POA: Diagnosis not present

## 2022-09-27 DIAGNOSIS — I10 Essential (primary) hypertension: Secondary | ICD-10-CM | POA: Diagnosis not present

## 2022-09-27 DIAGNOSIS — Z20822 Contact with and (suspected) exposure to covid-19: Secondary | ICD-10-CM | POA: Diagnosis not present

## 2022-09-27 DIAGNOSIS — J029 Acute pharyngitis, unspecified: Secondary | ICD-10-CM | POA: Diagnosis present

## 2022-09-27 LAB — RESP PANEL BY RT-PCR (FLU A&B, COVID) ARPGX2
Influenza A by PCR: NEGATIVE
Influenza B by PCR: NEGATIVE
SARS Coronavirus 2 by RT PCR: NEGATIVE

## 2022-09-27 MED ORDER — ACETAMINOPHEN 325 MG PO TABS
650.0000 mg | ORAL_TABLET | Freq: Once | ORAL | Status: AC
  Administered 2022-09-27: 650 mg via ORAL
  Filled 2022-09-27: qty 2

## 2022-09-27 NOTE — ED Triage Notes (Addendum)
Pt has had non-productive cough, subjective fever, sore throat and trouble sleeping since Wednesday. Denies diarrhea. Took nyquil with no relief; thinks he has COVID.

## 2022-09-27 NOTE — ED Provider Notes (Signed)
[facility] EMERGENCY DEPARTMENT  Provider Note  CSN: [id number] Arrival date & time: 09/27/22 6144  History Chief Complaint  Patient presents with   Cough    Dillon Mason is a 56 y.o. male with history of HTN reports 3 days of sore throat, nasal congestion, dry cough and subjective fever at home. Took some nyquil without relief. No known sick contacts. Worried he has Covid.    Home Medications Prior to Admission medications   Medication Sig Start Date End Date Taking? Authorizing Provider  ibuprofen (ADVIL,MOTRIN) 600 MG tablet Take 1 tablet (600 mg total) by mouth every 6 (six) hours as needed for pain. 09/04/13   Palumbo, April, MD  methocarbamol (ROBAXIN) 500 MG tablet Take 1 tablet (500 mg total) by mouth 2 (two) times daily. 03/02/22   Darrick Grinder, PA-C  modafinil (PROVIGIL) 100 MG tablet Take 100 mg by mouth daily.    [provider]     Allergies    Patient has no known allergies.   Review of Systems   Review of Systems Please see HPI for pertinent positives and negatives  Physical Exam BP (!) 155/109 (BP Location: Right Arm)   Pulse 73   Temp 98.2 F (36.8 C) (Oral)   Resp 20   Ht 6\' 2"  (1.88 m)   Wt 108.9 kg   SpO2 97%   BMI 30.81 kg/m   Physical Exam Vitals and nursing note reviewed.  Constitutional:      Appearance: Normal appearance.  HENT:     Head: Normocephalic and atraumatic.     Nose: Nose normal.     Mouth/Throat:     Mouth: Mucous membranes are moist.  Eyes:     Extraocular Movements: Extraocular movements intact.     Conjunctiva/sclera: Conjunctivae normal.  Cardiovascular:     Rate and Rhythm: Normal rate.  Pulmonary:     Effort: Pulmonary effort is normal.     Breath sounds: Normal breath sounds.  Abdominal:     General: Abdomen is flat.     Palpations: Abdomen is soft.     Tenderness: There is no abdominal tenderness.  Musculoskeletal:        General: No swelling. Normal range of motion.     Cervical back:  Neck supple.  Skin:    General: Skin is warm and dry.  Neurological:     General: No focal deficit present.     Mental Status: He is alert.  Psychiatric:        Mood and Affect: Mood normal.     ED Results / Procedures / Treatments   EKG None  Procedures Procedures  Medications Ordered in the ED Medications - No data to display  Initial Impression and Plan  Patient here with viral URI symptoms. Will check for Covid. Vitals and exam are reassuring.   ED Course   Clinical Course as of 09/27/22 0437  Sun Sep 27, 2022  0435 Covid/Flu are negative. Patient likely has a nonspecific viral URI. Recommend continued supportive care at home. PCP follow up or RTED if symptoms worsen.  [CS]    Clinical Course User Index [CS] Sep 29, 2022, MD     MDM Rules/Calculators/A&P Medical Decision Making Problems Addressed: Viral URI with cough: acute illness or injury  Amount and/or Complexity of Data Reviewed Labs: ordered. Decision-making details documented in ED Course.  Risk OTC drugs.    Final Clinical Impression(s) / ED Diagnoses Final diagnoses:  Viral URI with cough  Rx / DC Orders ED Discharge Orders     None        Truddie Hidden, MD 09/27/22 [phone]

## 2022-12-31 IMAGING — CR DG LUMBAR SPINE COMPLETE 4+V
5 series · 5 of 5 positions shown · non-contrast
Comparison: None.

CLINICAL DATA: Low back pain for 2 weeks, no known injury, initial
encounter

EXAM:
LUMBAR SPINE - COMPLETE 4+ VIEW

[t l-spine a.p.]
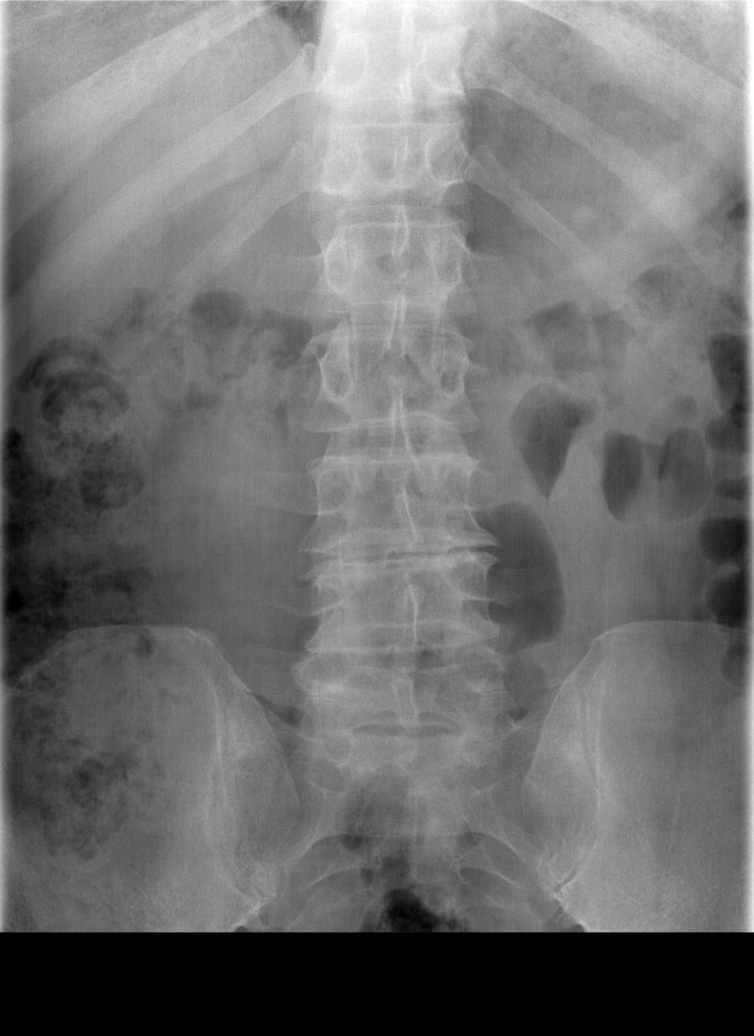

[t l-spine oblique exposure (1 of 2)]
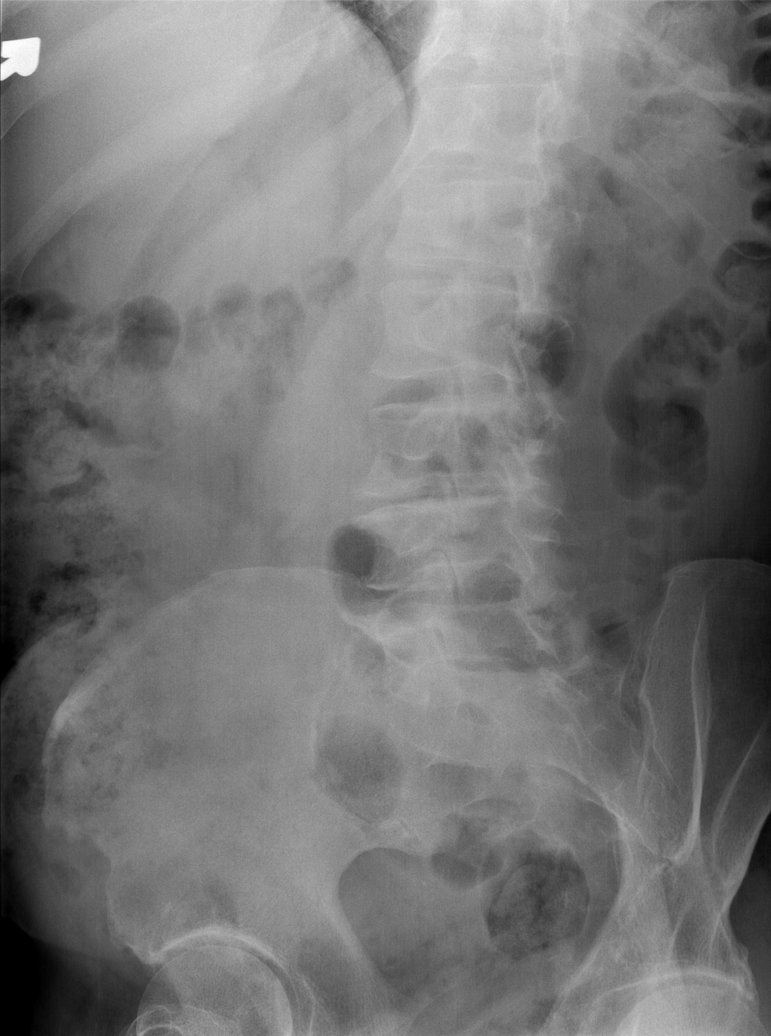

[t l-spine oblique exposure (2 of 2)]
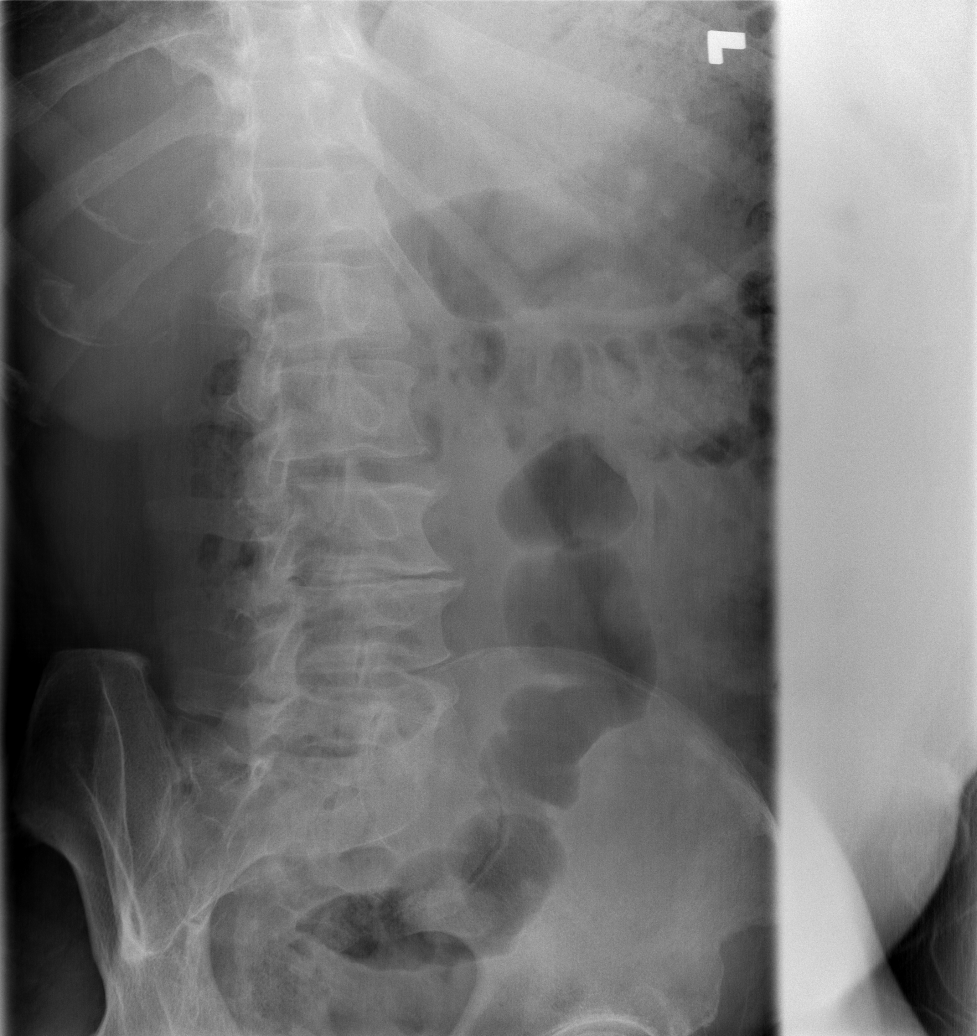

[t l-spine lat]
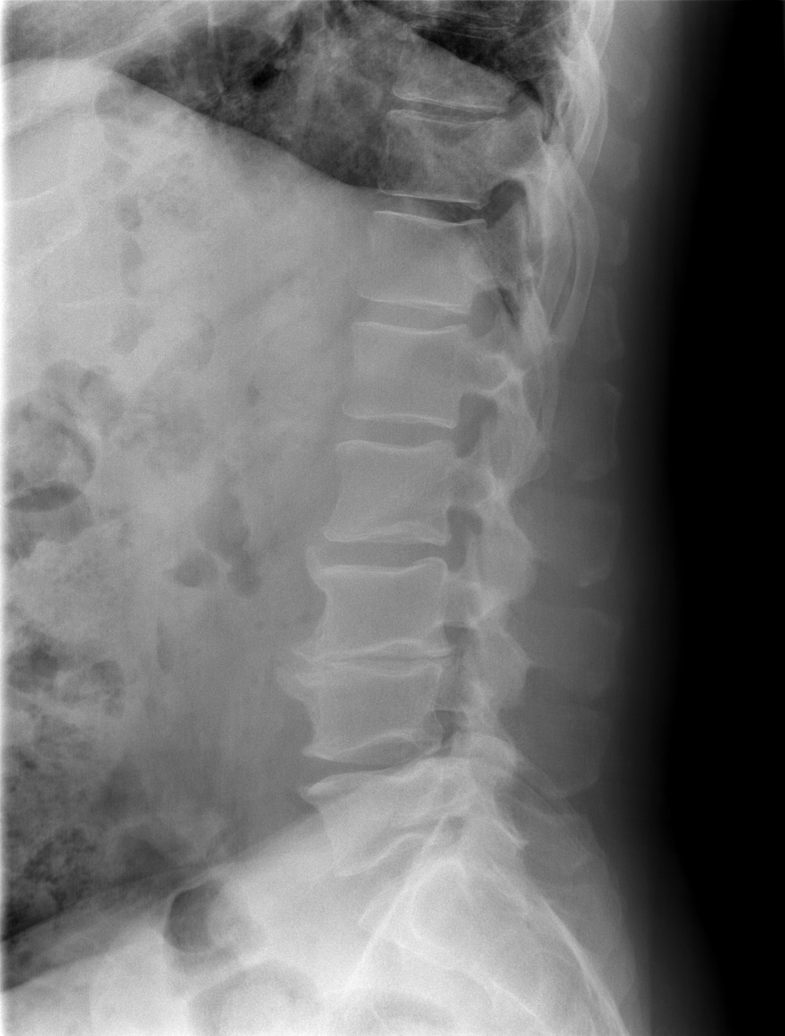

[t l-spine l5-s1 spot]
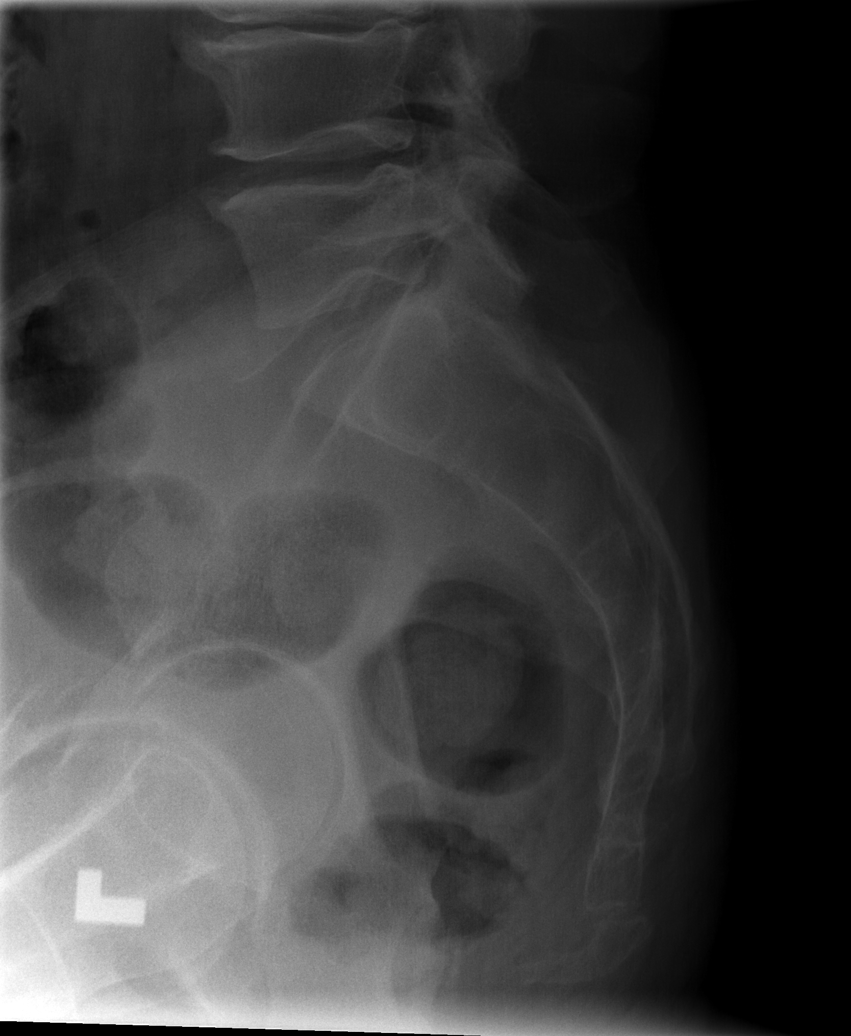

[5 of 5 positions shown; findings below may reference images not displayed]

FINDINGS: Five lumbar type vertebral bodies are well visualized. Vertebral
body height is well maintained. Osteophytic changes are noted from
L2-L5. Disc space narrowing at L3-4 and L4-5 is noted. No
anterolisthesis is seen. No pars defects are noted. No soft tissue
changes are seen.
IMPRESSION: Degenerative change without acute abnormality.

## 2024-07-22 ENCOUNTER — Emergency Department (HOSPITAL_BASED_OUTPATIENT_CLINIC_OR_DEPARTMENT_OTHER)
Admission: EM | Admit: 2024-07-22 | Discharge: 2024-07-22 | Disposition: A | Discharge status: Home / Self Care | Attending: Emergency Medicine | Admitting: Emergency Medicine

## 2024-07-22 ENCOUNTER — Other Ambulatory Visit: Payer: Self-pay

## 2024-07-22 DIAGNOSIS — L0231 Cutaneous abscess of buttock: Secondary | ICD-10-CM | POA: Diagnosis present

## 2024-07-22 DIAGNOSIS — L0291 Cutaneous abscess, unspecified: Secondary | ICD-10-CM

## 2024-07-22 MED ORDER — LIDOCAINE HCL (PF) 1 % IJ SOLN
5.0000 mL | Freq: Once | INTRAMUSCULAR | Status: AC
  Administered 2024-07-22: 5 mL
  Filled 2024-07-22: qty 5

## 2024-07-22 MED ORDER — DOXYCYCLINE HYCLATE 100 MG PO CAPS: 100.0000 mg | ORAL_CAPSULE | Freq: Two times a day (BID) | ORAL | 0 refills | Status: AC

## 2024-07-22 NOTE — ED Provider Notes (Signed)
 [place] [facility]Y DEPARTMENT AT [facility] Provider Note   CSN: [id number] Arrival date & time: 07/22/24  0848     Patient presents with: Abscess   Dillon Mason is a 58 y.o. male.   Patient here for evaluation of wound on his left buttocks.  This area has been firm and painful for the last couple days has been draining.  He was just in [place] on a trip.  He noticed a couple days ago a bump on his left buttocks got bigger opened up and drained.  Denies any medical problems.  Denies any history of diabetes.  Not immunocompromise.  No history of Crohn's or inflammatory bowel disease.  Nuys any fevers or chills.  The history is provided by the patient.       Prior to Admission medications   Medication Sig Start Date End Date Taking? Authorizing Provider  doxycycline  (VIBRAMYCIN ) 100 MG capsule Take 1 capsule (100 mg total) by mouth 2 (two) times daily. 07/22/24  Yes Andrea Colglazier, DO  ibuprofen  (ADVIL ,MOTRIN ) 600 MG tablet Take 1 tablet (600 mg total) by mouth every 6 (six) hours as needed for pain. 09/04/13   Palumbo, April, MD  methocarbamol  (ROBAXIN ) 500 MG tablet Take 1 tablet (500 mg total) by mouth 2 (two) times daily. 03/02/22   Logan Ubaldo NOVAK, PA-C  modafinil (PROVIGIL) 100 MG tablet Take 100 mg by mouth daily.    [provider]    Allergies: Patient has no known allergies.    Review of Systems  Updated Vital Signs BP (!) 136/102 (BP Location: Left Arm)   Pulse 90   Temp 98.2 F (36.8 C) (Oral)   Resp 18   Wt 108 kg   SpO2 98%   BMI 30.56 kg/m   Physical Exam Vitals and nursing note reviewed. Exam conducted with a chaperone present.  Constitutional:      Appearance: Normal appearance. He is not ill-appearing.  HENT:     Head: Atraumatic.  Cardiovascular:     Pulses: Normal pulses.  Pulmonary:     Effort: Pulmonary effort is normal.  Musculoskeletal:     Cervical back: Normal range of motion.  Skin:    Comments: In the middle of his  left buttocks he is got about a quarter sized area of draining abscess/wound with some fluctuance, does not involve the anus  Neurological:     General: No focal deficit present.     Mental Status: He is alert.     (all labs ordered are listed, but only abnormal results are displayed) Labs Reviewed - No data to display  EKG: None  Radiology: No results found.   .Incision and Drainage  Date/Time: 07/22/2024 9:18 AM  Performed by: Ruthe Cornet, DO Authorized by: Ruthe Cornet, DO   Consent:    Consent obtained:  Verbal   Consent given by:  Patient   Risks, benefits, and alternatives were discussed: yes     Risks discussed:  Bleeding, damage to other organs, incomplete drainage, infection and pain Universal protocol:    Procedure explained and questions answered to patient or proxy's satisfaction: yes     Patient identity confirmed:  Verbally with patient Location:    Type:  Abscess   Size:  3 x 3 cm   Location: left gluteal. Pre-procedure details:    Skin preparation:  Antiseptic wash Sedation:    Sedation type:  None Anesthesia:    Anesthesia method:  Local infiltration   Local anesthetic:  Lidocaine  1%  w/o epi Procedure type:    Complexity:  Simple Procedure details:    Incision types:  Stab incision   Incision depth:  Dermal   Wound management:  Probed and deloculated   Drainage:  Serosanguinous   Drainage amount:  Scant   Wound treatment:  Wound left open   Packing materials:  None Post-procedure details:    Procedure completion:  Tolerated    Medications Ordered in the ED  lidocaine  (PF) (XYLOCAINE ) 1 % injection 5 mL (has no administration in time range)                                    Medical Decision Making Risk Prescription drug management.   Dillon Mason is here with abscess on his left buttocks.  Normal vitals.  No fever.  He has no history of diabetes inflammatory bowel disease.  This is an abscess in the middle of his gluteus.  Does  not involve the rectum.  I have no concern for fistula.  Fairly superficial.  Looks like it has already been draining.  Made a small stab incision to open up drainage area little bit more to probing deloculated.  This was successful.  Will start him on antibiotics and have him follow-up with primary care doctor for wound recheck.  He was told to return if symptoms worsen.  Discharge.  Antibiotics prescribed.  Understands wound care instructions.  This chart was dictated using voice recognition software.  Despite best efforts to proofread,  errors can occur which can change the documentation meaning.      Final diagnoses:  Abscess    ED Discharge Orders          Ordered    doxycycline  (VIBRAMYCIN ) 100 MG capsule  2 times daily        07/22/24 0917               Ruthe Cornet, DO 07/22/24 0920

## 2024-07-22 NOTE — ED Triage Notes (Signed)
 Pt presents for a wound on his L buttock. States that the area has been firm and painful for several days, now draining and too painful to sit.  Recently returned from [place].  He has not been prescribed abx for this thus far.

## 2024-10-27 ENCOUNTER — Emergency Department (HOSPITAL_BASED_OUTPATIENT_CLINIC_OR_DEPARTMENT_OTHER)
Admission: EM | Admit: 2024-10-27 | Discharge: 2024-10-27 | Disposition: A | Discharge status: Home / Self Care | Attending: Emergency Medicine | Admitting: Emergency Medicine

## 2024-10-27 ENCOUNTER — Other Ambulatory Visit: Payer: Self-pay

## 2024-10-27 DIAGNOSIS — H6121 Impacted cerumen, right ear: Secondary | ICD-10-CM | POA: Insufficient documentation

## 2024-10-27 DIAGNOSIS — H9201 Otalgia, right ear: Secondary | ICD-10-CM | POA: Diagnosis present

## 2024-10-27 NOTE — ED Notes (Signed)
 Present nurse irrigate ear canal, ear wax came out, patient report feeling better. Palumbo MD notified

## 2024-10-27 NOTE — ED Provider Notes (Signed)
 [place] [facility] AT [facility] Provider Note   CSN: [id number] Arrival date & time: 10/27/24  0146     Patient presents with: Otalgia   Dillon Mason is a 58 y.o. male.   The history is provided by the patient.  Otalgia Location:  Right Behind ear:  No abnormality Onset quality:  Gradual Duration:  3 weeks Timing:  Constant Progression:  Unchanged Context: not direct blow, not elevation change and not water in ear   Relieved by:  Nothing Worsened by:  Nothing Ineffective treatments:  None tried Associated symptoms: no ear discharge and no fever   R ear feels stopped up and muffled and has been using home drops without relief.       Prior to Admission medications   Medication Sig Start Date End Date Taking? Authorizing Provider  doxycycline  (VIBRAMYCIN ) 100 MG capsule Take 1 capsule (100 mg total) by mouth 2 (two) times daily. 07/22/24   Curatolo, Adam, DO  ibuprofen  (ADVIL ,MOTRIN ) 600 MG tablet Take 1 tablet (600 mg total) by mouth every 6 (six) hours as needed for pain. 09/04/13   Castin Donaghue, MD  methocarbamol  (ROBAXIN ) 500 MG tablet Take 1 tablet (500 mg total) by mouth 2 (two) times daily. 03/02/22   Logan Ubaldo NOVAK, PA-C  modafinil (PROVIGIL) 100 MG tablet Take 100 mg by mouth daily.    [provider]    Allergies: Patient has no known allergies.    Review of Systems  Constitutional:  Negative for fever.  HENT:  Positive for ear pain. Negative for ear discharge, facial swelling and mouth sores.   All other systems reviewed and are negative.   Updated Vital Signs BP 133/87 (BP Location: Right Arm)   Pulse 66   Temp 97.9 F (36.6 C) (Oral)   Resp 16   SpO2 97%   Physical Exam Vitals and nursing note reviewed.  Constitutional:      General: He is not in acute distress.    Appearance: He is well-developed. He is not diaphoretic.  HENT:     Head: Normocephalic and atraumatic.     Ears:     Comments: Wax in right canal      Nose: Nose normal.  Eyes:     Conjunctiva/sclera: Conjunctivae normal.     Pupils: Pupils are equal, round, and reactive to light.  Cardiovascular:     Rate and Rhythm: Normal rate and regular rhythm.     Pulses: Normal pulses.     Heart sounds: Normal heart sounds.  Pulmonary:     Effort: Pulmonary effort is normal.     Breath sounds: Normal breath sounds. No wheezing or rales.  Abdominal:     General: Bowel sounds are normal.     Palpations: Abdomen is soft.     Tenderness: There is no abdominal tenderness. There is no guarding or rebound.  Musculoskeletal:        General: Normal range of motion.     Cervical back: Normal range of motion and neck supple.  Skin:    General: Skin is warm and dry.     Capillary Refill: Capillary refill takes less than 2 seconds.  Neurological:     General: No focal deficit present.     Mental Status: He is alert and oriented to person, place, and time.     (all labs ordered are listed, but only abnormal results are displayed) Labs Reviewed - No data to display  EKG: None  Radiology: No results  found.   Procedures   Medications Ordered in the ED - No data to display                                  Medical Decision Making R ear muffled   Amount and/or Complexity of Data Reviewed External Data Reviewed: notes.    Details: Previous notes reviewed   Risk Risk Details: Irrigated in the ED with resolution of symptoms.  TM is normal post irrigation.  Stable for discharge.  Strict returns    Final diagnoses:  Impacted cerumen of right ear   No signs of systemic illness or infection. The patient is nontoxic-appearing on exam and vital signs are within normal limits.  I have reviewed the triage vital signs and the nursing notes. Pertinent labs & imaging results that were available during my care of the patient were reviewed by me and considered in my medical decision making (see chart for details). After history, exam, and  medical workup I feel the patient has been appropriately medically screened and is safe for discharge home. Pertinent diagnoses were discussed with the patient. Patient was given return precautions.      ED Discharge Orders     None          Malyna Budney, MD 10/27/24 0221

## 2024-10-27 NOTE — ED Triage Notes (Signed)
 Pt report feeling like right ear exterior channel is clog, denies pain. Alert and oriented.   Pt attempted to flushed at home but still feels clog.
